# Patient Record
Sex: Male | Born: 1990 | Hispanic: Yes | Marital: Single | Smoking: Never smoker
Health system: Southern US, Community
[De-identification: ages and names within clinical notes are randomized; demographics above are authoritative.]

---

## 2021-05-30 ENCOUNTER — Emergency Department (HOSPITAL_COMMUNITY): Payer: Self-pay

## 2021-05-30 ENCOUNTER — Other Ambulatory Visit: Payer: Self-pay

## 2021-05-30 ENCOUNTER — Encounter (HOSPITAL_COMMUNITY): Payer: Self-pay | Admitting: Emergency Medicine

## 2021-05-30 ENCOUNTER — Emergency Department (HOSPITAL_COMMUNITY)
Admission: EM | Admit: 2021-05-30 | Discharge: 2021-05-30 | Disposition: A | Discharge status: Home / Self Care | Payer: Self-pay | Attending: Emergency Medicine | Admitting: Emergency Medicine

## 2021-05-30 DIAGNOSIS — R0789 Other chest pain: Secondary | ICD-10-CM | POA: Insufficient documentation

## 2021-05-30 DIAGNOSIS — R42 Dizziness and giddiness: Secondary | ICD-10-CM | POA: Insufficient documentation

## 2021-05-30 DIAGNOSIS — Z20822 Contact with and (suspected) exposure to covid-19: Secondary | ICD-10-CM | POA: Insufficient documentation

## 2021-05-30 LAB — CBC
HCT: 48.5 % (ref 39.0–52.0)
Hemoglobin: 16.7 g/dL (ref 13.0–17.0)
MCH: 28.9 pg (ref 26.0–34.0)
MCHC: 34.4 g/dL (ref 30.0–36.0)
MCV: 83.9 fL (ref 80.0–100.0)
Platelets: 206 10*3/uL (ref 150–400)
RBC: 5.78 MIL/uL (ref 4.22–5.81)
RDW: 13 % (ref 11.5–15.5)
WBC: 13.1 10*3/uL — ABNORMAL HIGH (ref 4.0–10.5)
nRBC: 0 % (ref 0.0–0.2)

## 2021-05-30 LAB — BASIC METABOLIC PANEL
Anion gap: 11 (ref 5–15)
BUN: 14 mg/dL (ref 6–20)
CO2: 23 mmol/L (ref 22–32)
Calcium: 9.4 mg/dL (ref 8.9–10.3)
Chloride: 104 mmol/L (ref 98–111)
Creatinine, Ser: 1.04 mg/dL (ref 0.61–1.24)
GFR, Estimated: >60 mL/min (ref >60)
Glucose, Bld: 153 mg/dL — ABNORMAL HIGH (ref 70–99)
Potassium: 3.3 mmol/L — ABNORMAL LOW (ref 3.5–5.1)
Sodium: 138 mmol/L (ref 135–145)

## 2021-05-30 LAB — TROPONIN I (HIGH SENSITIVITY)
Troponin I (High Sensitivity): 3 ng/L (ref <18)
Troponin I (High Sensitivity): 2 ng/L (ref <18)

## 2021-05-30 LAB — RESP PANEL BY RT-PCR (FLU A&B, COVID) ARPGX2
Influenza A by PCR: NEGATIVE
Influenza B by PCR: NEGATIVE
SARS Coronavirus 2 by RT PCR: NEGATIVE

## 2021-05-30 MED ORDER — SODIUM CHLORIDE 0.9 % IV BOLUS
1000.0000 mL | Freq: Once | INTRAVENOUS | Status: AC
  Administered 2021-05-30: 1000 mL via INTRAVENOUS

## 2021-05-30 MED ORDER — LACTATED RINGERS IV BOLUS
1000.0000 mL | Freq: Once | INTRAVENOUS | Status: AC
Start: 2021-05-30 — End: 2021-05-30
  Administered 2021-05-30: 1000 mL via INTRAVENOUS

## 2021-05-30 NOTE — ED Triage Notes (Signed)
Pt c/o CP/pressure, dizziness, feverish/chilled immediately came to hospital after symptom onset. Denies SHOB, nausea, vomiting. Unknown sick contact, no hx. Pt reports palpitations, tachycardia, recent episodes of hypertension

## 2021-05-30 NOTE — Discharge Instructions (Addendum)
Your work-up today was reassuring.  Your symptoms did resolve while you are in the emergency room.  Your cardiac enzyme were negative.  EKG was without signs of heart attack.  Chest x-ray did not show pneumonia or other concerns.  He received some IV fluids in the emergency room.  You have worsening symptoms please return to the emergency room.  Given your ongoing chest pain over the past 3 months it would be wise to set up a primary care provider.  I have attached resources for you to reach out to and establish primary care provider.

## 2021-05-30 NOTE — ED Provider Notes (Addendum)
[facility] EMERGENCY DEPARTMENT Provider Note   CSN: [id number] Arrival date & time: 05/30/21  1954     History  Chief Complaint  Patient presents with   Chest Pain   Dizziness    Mason Jenkins is a 31 y.o. male.  31 year old male presents today for evaluation of left-sided chest pain, chills, dizziness of sudden onset just prior to arrival.  Patient currently reports his symptoms have resolved with the exception of chills.  When asked about chest pain and if it occurred prior to today he endorses chest pain for the past 3 months which has been intermittent and nonradiating, without associated diaphoresis, lightheadedness, or palpitations.  Denies cough, sinus congestion, shortness of breath before or after this episode.  Without long recent travel, denies hemoptysis, without pleuritic chest pain, without leg swelling or pain.  The history is provided by the patient and a friend. A language interpreter was used.  Chest Pain Associated symptoms: dizziness   Associated symptoms: no abdominal pain, no fever, no headache, no nausea, no shortness of breath, no vomiting and no weakness   Dizziness Associated symptoms: chest pain   Associated symptoms: no headaches, no nausea, no shortness of breath, no vomiting and no weakness       Home Medications Prior to Admission medications   Not on File      Allergies    Patient has no allergy information on record.    Review of Systems   Review of Systems  Constitutional:  Positive for chills. Negative for fever.  HENT:  Negative for congestion and sore throat.   Respiratory:  Negative for shortness of breath.   Cardiovascular:  Positive for chest pain.  Gastrointestinal:  Negative for abdominal pain, nausea and vomiting.  Genitourinary:  Negative for dysuria.  Neurological:  Positive for dizziness. Negative for weakness, light-headedness and headaches.  All other systems reviewed and are negative.  Physical  Exam Updated Vital Signs There were no vitals taken for this visit. Physical Exam Vitals and nursing note reviewed.  Constitutional:      General: He is not in acute distress.    Appearance: Normal appearance. He is not ill-appearing.  HENT:     Head: Normocephalic and atraumatic.     Nose: Nose normal.  Eyes:     General: No scleral icterus.    Extraocular Movements: Extraocular movements intact.     Conjunctiva/sclera: Conjunctivae normal.  Cardiovascular:     Rate and Rhythm: Normal rate and regular rhythm.     Pulses: Normal pulses.     Heart sounds: Normal heart sounds.  Pulmonary:     Effort: Pulmonary effort is normal. No respiratory distress.     Breath sounds: Normal breath sounds. No wheezing or rales.  Abdominal:     General: There is no distension.     Palpations: Abdomen is soft.     Tenderness: There is no abdominal tenderness. There is no guarding.  Musculoskeletal:        General: Normal range of motion.     Cervical back: Normal range of motion.     Right lower leg: No edema.     Left lower leg: No edema.  Skin:    General: Skin is warm and dry.  Neurological:     General: No focal deficit present.     Mental Status: He is alert. Mental status is at baseline.    ED Results / Procedures / Treatments   Labs (all labs ordered are  listed, but only abnormal results are displayed) Labs Reviewed  BASIC METABOLIC PANEL  CBC  TROPONIN I (HIGH SENSITIVITY)    EKG None  Radiology No results found.  Procedures Procedures    Medications Ordered in ED Medications - No data to display  ED Course/ Medical Decision Making/ A&P                           Medical Decision Making Amount and/or Complexity of Data Reviewed Labs: ordered. Radiology: ordered.   Medical Decision Making / ED Course   This patient presents to the ED for concern of chest pain, fever, dizziness, this involves an extensive number of treatment options, and is a complaint that  carries with it a high risk of complications and morbidity.  The differential diagnosis includes ACS, pneumonia, URI, PE, MSK injury  MDM: 31 year old male presents today for evaluation of left-sided chest pain, chills, and dizziness of sudden onset just prior to arrival.  Patient is currently without chest pain or dizziness but does report some chills.  Patient reports intermittent history of chest pain over the past 3 months.  Unsure of alleviating or aggravating factors.  Patient also does manual labor and works outdoors.  Denies adequate hydration.  He is tachycardic at about 120.  We will provide IV hydration, obtain labs including troponin.  Obtain chest x-ray.  EKG without acute ischemic changes.  Troponin negative x2.  Respiratory panel negative for COVID, flu.  CBC with mild leukocytosis of 13,000 without other signs or symptoms of infection.  He is without abdominal pain, sinus congestion, dental infection, productive cough, dysuria, shortness of breath.  Low risk for PE given he is without hemoptysis, shortness of breath, tachypnea.  His only risk factor is tachycardia which has improved with IV hydration.  Given reassuring work-up patient is appropriate for discharge.  Will provide with resources establish primary care provider.  Return precautions discussed with patient and his parent at length.    Additional history obtained: -Additional history obtained from Through friend who is at bedside.  Confirms that patient has had chest pain over the past 3 months. -External records from outside source obtained and reviewed including: Chart review including previous notes, labs, imaging, consultation notes   Lab Tests: -I ordered, reviewed, and interpreted labs.   The pertinent results include:   Labs Reviewed  CBC - Abnormal; Notable for the following components:      Result Value   WBC 13.1 (*)    All other components within normal limits  RESP PANEL BY RT-PCR (FLU A&B, COVID) [id number]   BASIC METABOLIC PANEL  TROPONIN I (HIGH SENSITIVITY)      EKG  EKG Interpretation  Date/Time:    Ventricular Rate:    PR Interval:    QRS Duration:   QT Interval:    QTC Calculation:   R Axis:     Text Interpretation:           Imaging Studies ordered: I ordered imaging studies including chest x-ray I independently visualized and interpreted imaging. I agree with the radiologist interpretation   Medicines ordered and prescription drug management: No orders of the defined types were placed in this encounter.   -I have reviewed the patients home medicines and have made adjustments as needed  Cardiac Monitoring: The patient was maintained on a cardiac monitor.  I personally viewed and interpreted the cardiac monitored which showed an underlying rhythm of: Sinus tachycardia  Social  Determinants of Health:  Factors impacting patients care include: Does not have primary care provider established.  Did provide resources to establish primary care provider out in the community   Reevaluation: After the interventions noted above, I reevaluated the patient and found that they have :resolved  Co morbidities that complicate the patient evaluation No past medical history on file.    Dispostion: Patient remained asymptomatic in the emergency room.  Patient is appropriate for discharge.  Discharged in appropriate condition.  Plan emergency room course discussed with patient and his friend at bedside through an interpreter.  They both voiced understanding and are in agreement with plan.  Discussed importance of establishing a primary care provider and following up.  Resources to establish primary care provider provider.  Final Clinical Impression(s) / ED Diagnoses Final diagnoses:  Atypical chest pain    Rx / DC Orders ED Discharge Orders     None         Evlyn Courier, PA-C 05/30/21 2307    Evlyn Courier, PA-C 05/30/21 2316    Drenda Freeze, MD 05/30/21  [phone]

## 2021-06-10 ENCOUNTER — Other Ambulatory Visit: Payer: Self-pay

## 2021-06-10 ENCOUNTER — Emergency Department (HOSPITAL_COMMUNITY): Payer: Self-pay

## 2021-06-10 ENCOUNTER — Encounter (HOSPITAL_COMMUNITY): Payer: Self-pay | Admitting: *Deleted

## 2021-06-10 ENCOUNTER — Emergency Department (HOSPITAL_COMMUNITY)
Admission: EM | Admit: 2021-06-10 | Discharge: 2021-06-11 | Discharge status: Against Medical Advice | Payer: Self-pay | Attending: Emergency Medicine | Admitting: Emergency Medicine

## 2021-06-10 DIAGNOSIS — R42 Dizziness and giddiness: Secondary | ICD-10-CM | POA: Insufficient documentation

## 2021-06-10 DIAGNOSIS — Z5321 Procedure and treatment not carried out due to patient leaving prior to being seen by health care provider: Secondary | ICD-10-CM | POA: Insufficient documentation

## 2021-06-10 DIAGNOSIS — Z20822 Contact with and (suspected) exposure to covid-19: Secondary | ICD-10-CM | POA: Insufficient documentation

## 2021-06-10 DIAGNOSIS — R030 Elevated blood-pressure reading, without diagnosis of hypertension: Secondary | ICD-10-CM | POA: Insufficient documentation

## 2021-06-10 DIAGNOSIS — R110 Nausea: Secondary | ICD-10-CM | POA: Insufficient documentation

## 2021-06-10 MED ORDER — MECLIZINE HCL 25 MG PO TABS
25.0000 mg | ORAL_TABLET | Freq: Once | ORAL | Status: AC
Start: 2021-06-10 — End: 2021-06-10
  Administered 2021-06-10: 25 mg via ORAL
  Filled 2021-06-10 (×2): qty 1

## 2021-06-10 NOTE — ED Notes (Signed)
Patient transported to CT 

## 2021-06-10 NOTE — ED Provider Triage Note (Signed)
Emergency Medicine Provider Triage Evaluation Note  Mason Jenkins , a 31 y.o. male  was evaluated in triage.  Pt complains of Dizziness and vomiting.Marland Kitchen onset a few hours ago. He feels agitated. Nausea is improved. Balance is off when standing. No hx of the same. No ringing in his ear  Review of Systems  Positive: dizziness Negative: Vision change  Physical Exam  BP (!) 159/103 (BP Location: Right Arm)    Pulse 87    Temp 99.3 F (37.4 C) (Oral)    Resp 16    SpO2 100%  Gen:   Awake, no distress   Resp:  Normal effort  MSK:   Moves extremities without difficulty  Other:  Mild Right lateral nystagmus  Medical Decision Making  Medically screening exam initiated at 11:16 PM.  Appropriate orders placed.  Mason Jenkins was informed that the remainder of the evaluation will be completed by another provider, this initial triage assessment does not replace that evaluation, and the importance of remaining in the ED until their evaluation is complete.  Work up initiated   [employer], PA-C 06/10/21 2318

## 2021-06-10 NOTE — ED Triage Notes (Signed)
Pt states he became dizzy and nauseated today, significant other says that his blood pressure was elevated 160's /110's when she took it tonight. Denies pain.

## 2021-06-11 LAB — DIFFERENTIAL
Abs Immature Granulocytes: 0.04 10*3/uL (ref 0.00–0.07)
Basophils Absolute: 0.1 10*3/uL (ref 0.0–0.1)
Basophils Relative: 1 %
Eosinophils Absolute: 0.1 10*3/uL (ref 0.0–0.5)
Eosinophils Relative: 1 %
Immature Granulocytes: 0 %
Lymphocytes Relative: 25 %
Lymphs Abs: 2.3 10*3/uL (ref 0.7–4.0)
Monocytes Absolute: 0.5 10*3/uL (ref 0.1–1.0)
Monocytes Relative: 5 %
Neutro Abs: 6.2 10*3/uL (ref 1.7–7.7)
Neutrophils Relative %: 68 %

## 2021-06-11 LAB — I-STAT CHEM 8, ED
BUN: 16 mg/dL (ref 6–20)
Calcium, Ion: 1.17 mmol/L (ref 1.15–1.40)
Chloride: 105 mmol/L (ref 98–111)
Creatinine, Ser: 0.8 mg/dL (ref 0.61–1.24)
Glucose, Bld: 109 mg/dL — ABNORMAL HIGH (ref 70–99)
HCT: 48 % (ref 39.0–52.0)
Hemoglobin: 16.3 g/dL (ref 13.0–17.0)
Potassium: 3.6 mmol/L (ref 3.5–5.1)
Sodium: 140 mmol/L (ref 135–145)
TCO2: 25 mmol/L (ref 22–32)

## 2021-06-11 LAB — CBC
HCT: 47 % (ref 39.0–52.0)
Hemoglobin: 16.1 g/dL (ref 13.0–17.0)
MCH: 28.9 pg (ref 26.0–34.0)
MCHC: 34.3 g/dL (ref 30.0–36.0)
MCV: 84.2 fL (ref 80.0–100.0)
Platelets: 220 10*3/uL (ref 150–400)
RBC: 5.58 MIL/uL (ref 4.22–5.81)
RDW: 12.7 % (ref 11.5–15.5)
WBC: 9.2 10*3/uL (ref 4.0–10.5)
nRBC: 0 % (ref 0.0–0.2)

## 2021-06-11 LAB — URINALYSIS, ROUTINE W REFLEX MICROSCOPIC
Bacteria, UA: NONE SEEN
Bilirubin Urine: NEGATIVE
Glucose, UA: NEGATIVE mg/dL
Hgb urine dipstick: NEGATIVE
Ketones, ur: NEGATIVE mg/dL
Leukocytes,Ua: NEGATIVE
Nitrite: NEGATIVE
Protein, ur: NEGATIVE mg/dL
Specific Gravity, Urine: 1.015 (ref 1.005–1.030)
pH: 7 (ref 5.0–8.0)

## 2021-06-11 LAB — COMPREHENSIVE METABOLIC PANEL
ALT: 124 U/L — ABNORMAL HIGH (ref 0–44)
AST: 59 U/L — ABNORMAL HIGH (ref 15–41)
Albumin: 3.9 g/dL (ref 3.5–5.0)
Alkaline Phosphatase: 70 U/L (ref 38–126)
Anion gap: 11 (ref 5–15)
BUN: 14 mg/dL (ref 6–20)
CO2: 21 mmol/L — ABNORMAL LOW (ref 22–32)
Calcium: 9.2 mg/dL (ref 8.9–10.3)
Chloride: 105 mmol/L (ref 98–111)
Creatinine, Ser: 0.9 mg/dL (ref 0.61–1.24)
GFR, Estimated: >60 mL/min (ref >60)
Glucose, Bld: 116 mg/dL — ABNORMAL HIGH (ref 70–99)
Potassium: 3.5 mmol/L (ref 3.5–5.1)
Sodium: 137 mmol/L (ref 135–145)
Total Bilirubin: 0.6 mg/dL (ref 0.3–1.2)
Total Protein: 7.5 g/dL (ref 6.5–8.1)

## 2021-06-11 LAB — RESP PANEL BY RT-PCR (FLU A&B, COVID) ARPGX2
Influenza A by PCR: NEGATIVE
Influenza B by PCR: NEGATIVE
SARS Coronavirus 2 by RT PCR: NEGATIVE

## 2021-06-11 LAB — RAPID URINE DRUG SCREEN, HOSP PERFORMED
Amphetamines: NOT DETECTED
Barbiturates: NOT DETECTED
Benzodiazepines: NOT DETECTED
Cocaine: NOT DETECTED
Opiates: NOT DETECTED
Tetrahydrocannabinol: NOT DETECTED

## 2021-06-11 LAB — PROTIME-INR
INR: 1 (ref 0.8–1.2)
Prothrombin Time: 13.3 seconds (ref 11.4–15.2)

## 2021-06-11 LAB — APTT: aPTT: 28 seconds (ref 24–36)

## 2021-06-11 LAB — ETHANOL: Alcohol, Ethyl (B): <10 mg/dL (ref <10)

## 2021-06-11 NOTE — ED Notes (Signed)
No answer for VS x3 

## 2021-06-12 ENCOUNTER — Ambulatory Visit (HOSPITAL_COMMUNITY)
Admission: EM | Admit: 2021-06-12 | Discharge: 2021-06-12 | Disposition: A | Discharge status: Home / Self Care | Payer: Self-pay | Attending: Family Medicine | Admitting: Family Medicine

## 2021-06-12 ENCOUNTER — Encounter (HOSPITAL_COMMUNITY): Payer: Self-pay

## 2021-06-12 ENCOUNTER — Other Ambulatory Visit: Payer: Self-pay

## 2021-06-12 DIAGNOSIS — J019 Acute sinusitis, unspecified: Secondary | ICD-10-CM

## 2021-06-12 DIAGNOSIS — I10 Essential (primary) hypertension: Secondary | ICD-10-CM

## 2021-06-12 DIAGNOSIS — R011 Cardiac murmur, unspecified: Secondary | ICD-10-CM

## 2021-06-12 MED ORDER — CETIRIZINE HCL 10 MG PO TABS: 10.0000 mg | ORAL_TABLET | Freq: Every day | ORAL | 0 refills | Status: AC

## 2021-06-12 MED ORDER — AMLODIPINE BESYLATE 5 MG PO TABS: 5.0000 mg | ORAL_TABLET | Freq: Every day | ORAL | 0 refills | Status: AC

## 2021-06-12 MED ORDER — AMOXICILLIN 875 MG PO TABS: 875.0000 mg | ORAL_TABLET | Freq: Two times a day (BID) | ORAL | 0 refills | Status: AC

## 2021-06-12 NOTE — ED Triage Notes (Signed)
Pt presents with nausea, HA, and chills that began 1 week ago.

## 2021-06-12 NOTE — Discharge Instructions (Addendum)
Cardiology office will contact you to schedule a follow-up appointment.  Please keep appointment as I do recommend you have a full cardiac work-up to determine the source of the murmur and for monitoring of your high blood pressure

## 2021-06-12 NOTE — ED Provider Notes (Signed)
[facility]    CSN: [id number] Arrival date & time: 06/12/21  1813      History   Chief Complaint Chief Complaint  Patient presents with   Nausea   Chills   Headache    HPI Mason Jenkins is a 31 y.o. male.   HPI Patient presents today with nausea, chills and headache.  Patient has been seen at the ER twice within the last 2 weeks.  Most recent visit was on June 10, 2021 which was 2 days ago.  Patient was seen with a headache along with nausea and chills.  Patient had a head CT , findings consistent with that of sinus disease due to moderate mucosal thickening in the maxillary sinus region.  Patient was not seen by provider therefore was not treated at the ER.  Of note patient has had high blood pressure readings at home per wife who is present with him at visit today.  Patient's vital signs were taken on February 8 blood pressure was 153/103 and today BP 162/100.  Wife reports she thought blood pressure was elevated due to stress.  Patient has had no preventative healthcare therefore is unaware of how long his blood pressure has been elevated.  He continues to have headache occasional dizziness and nausea.  He has a low-grade fever of 99.2.  He has not taken any medication for symptoms.  He is not currently experiencing any chest pain or shortness of breath  History reviewed. No pertinent past medical history.  There are no problems to display for this patient.   History reviewed. No pertinent surgical history.     Home Medications    Prior to Admission medications   Medication Sig Start Date End Date Taking? Authorizing Provider  amLODipine (NORVASC) 5 MG tablet Take 1 tablet (5 mg total) by mouth daily. 06/12/21  Yes Bing Neighbors, FNP  amoxicillin (AMOXIL) 875 MG tablet Take 1 tablet (875 mg total) by mouth 2 (two) times daily. 06/12/21  Yes Bing Neighbors, FNP  cetirizine (ZYRTEC) 10 MG tablet Take 1 tablet (10 mg total) by mouth daily. 06/12/21   Yes Bing Neighbors, FNP  naproxen sodium (ALEVE) 220 MG tablet Take 440 mg by mouth daily as needed (headache).    [provider]    Family History History reviewed. No pertinent family history.  Social History Social History   Tobacco Use   Smoking status: Never   Smokeless tobacco: Never     Allergies   Patient has no known allergies.   Review of Systems Review of Systems Pertinent negatives listed in HPI   Physical Exam Triage Vital Signs ED Triage Vitals  Enc Vitals Group     BP 06/12/21 1828 (!) 162/100     Pulse Rate 06/12/21 1828 100     Resp 06/12/21 1828 14     Temp 06/12/21 1828 99.2 F (37.3 C)     Temp Source 06/12/21 1828 Oral     SpO2 06/12/21 1828 96 %     Weight --      Height --      Head Circumference --      Peak Flow --      Pain Score 06/12/21 1830 0     Pain Loc --      Pain Edu? --      Excl. in GC? --    No data found.  Updated Vital Signs BP (!) 162/100 (BP Location: Right Arm)  Pulse 100    Temp 99.2 F (37.3 C) (Oral)    Resp 14    SpO2 96%   Visual Acuity Right Eye Distance:   Left Eye Distance:   Bilateral Distance:    Right Eye Near:   Left Eye Near:    Bilateral Near:     Physical Exam Constitutional:      Appearance: He is obese. He is ill-appearing.  HENT:     Head: Normocephalic.     Nose: Congestion and rhinorrhea present.  Eyes:     Extraocular Movements: Extraocular movements intact.     Pupils: Pupils are equal, round, and reactive to light.  Neck:     Vascular: Normal carotid pulses. No JVD.     Trachea: Trachea normal.  Cardiovascular:     Rate and Rhythm: Regular rhythm. Tachycardia present.     Heart sounds: Murmur heard.     Comments: Grade 3 murmur present 3rd ICS Musculoskeletal:     Cervical back: Normal range of motion.  Lymphadenopathy:     Cervical: Cervical adenopathy present.  Skin:    General: Skin is warm and dry.     Capillary Refill: Capillary refill takes less than  2 seconds.  Neurological:     General: No focal deficit present.     Mental Status: He is oriented to person, place, and time.  Psychiatric:        Mood and Affect: Mood normal.        Behavior: Behavior normal.        Thought Content: Thought content normal.     UC Treatments / Results  Labs (all labs ordered are listed, but only abnormal results are displayed) Labs Reviewed - No data to display  EKG   Radiology CT HEAD WO CONTRAST  Result Date: 06/10/2021 CLINICAL DATA:  Dizziness EXAM: CT HEAD WITHOUT CONTRAST TECHNIQUE: Contiguous axial images were obtained from the base of the skull through the vertex without intravenous contrast. RADIATION DOSE REDUCTION: This exam was performed according to the departmental dose-optimization program which includes automated exposure control, adjustment of the mA and/or kV according to patient size and/or use of iterative reconstruction technique. COMPARISON:  None. FINDINGS: Brain: No evidence of acute infarction, hemorrhage, hydrocephalus, extra-axial collection or mass lesion/mass effect. Vascular: No hyperdense vessel or unexpected calcification. Skull: Normal. Negative for fracture or focal lesion. Sinuses/Orbits: Moderate mucosal thickening in the maxillary sinuses Other: None IMPRESSION: 1. Negative non contrasted CT appearance of the brain. 2. Sinus disease Electronically Signed   By: Jasmine Pang M.D.   On: 06/10/2021 23:57    Procedures Procedures (including critical care time)  Medications Ordered in UC Medications - No data to display  Initial Impression / Assessment and Plan / UC Course  I have reviewed the triage vital signs and the nursing notes.  Pertinent labs & imaging results that were available during my care of the patient were reviewed by me and considered in my medical decision making (see chart for details).    Essential hypertension uncontrolled, treatment today start amlodipine 5 mg daily at bedtime for control of  blood pressure.  Continue to monitor blood pressure at home.  Referral placed to cardiology due to undiagnosed audible heart murmur auscultated on exam.  Given patient's elevated blood pressure and murmur patient warrants further work-up in the setting of cardiology as patient has not had any preventative maintenance care uncertain of how long patient has been suffering from uncontrolled hypertension.  Acute sinusitis treatment today  with amoxicillin 875 twice daily for 10 days.  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  Strict ER precautions given if any of his symptoms worsen or do not readily improve.  Return here for evaluation as needed.  Final Clinical Impressions(s) / UC Diagnoses   Final diagnoses:  Essential hypertension  Undiagnosed cardiac murmurs  Acute non-recurrent sinusitis, unspecified location     Discharge Instructions      Cardiology office will contact you to schedule a follow-up appointment.  Please keep appointment as I do recommend you have a full cardiac work-up to determine the source of the murmur and for monitoring of your high blood pressure     ED Prescriptions     Medication Sig Dispense Auth. Provider   amoxicillin (AMOXIL) 875 MG tablet Take 1 tablet (875 mg total) by mouth 2 (two) times daily. 20 tablet Bing Neighbors, FNP   cetirizine (ZYRTEC) 10 MG tablet Take 1 tablet (10 mg total) by mouth daily. 90 tablet Bing Neighbors, FNP   amLODipine (NORVASC) 5 MG tablet Take 1 tablet (5 mg total) by mouth daily. 90 tablet Bing Neighbors, FNP      PDMP not reviewed this encounter.   Bing Neighbors, [place] 06/12/21 2036

## 2021-06-15 ENCOUNTER — Emergency Department (HOSPITAL_COMMUNITY)
Admission: EM | Admit: 2021-06-15 | Discharge: 2021-06-15 | Disposition: A | Discharge status: Home / Self Care | Payer: Self-pay | Attending: Emergency Medicine | Admitting: Emergency Medicine

## 2021-06-15 ENCOUNTER — Encounter (HOSPITAL_COMMUNITY): Payer: Self-pay | Admitting: *Deleted

## 2021-06-15 ENCOUNTER — Emergency Department (HOSPITAL_COMMUNITY): Payer: Self-pay

## 2021-06-15 ENCOUNTER — Other Ambulatory Visit: Payer: Self-pay

## 2021-06-15 DIAGNOSIS — R7989 Other specified abnormal findings of blood chemistry: Secondary | ICD-10-CM

## 2021-06-15 DIAGNOSIS — M7918 Myalgia, other site: Secondary | ICD-10-CM | POA: Insufficient documentation

## 2021-06-15 DIAGNOSIS — R000 Tachycardia, unspecified: Secondary | ICD-10-CM | POA: Insufficient documentation

## 2021-06-15 DIAGNOSIS — I10 Essential (primary) hypertension: Secondary | ICD-10-CM | POA: Insufficient documentation

## 2021-06-15 DIAGNOSIS — E039 Hypothyroidism, unspecified: Secondary | ICD-10-CM | POA: Insufficient documentation

## 2021-06-15 LAB — COMPREHENSIVE METABOLIC PANEL
ALT: 134 U/L — ABNORMAL HIGH (ref 0–44)
AST: 79 U/L — ABNORMAL HIGH (ref 15–41)
Albumin: 4.1 g/dL (ref 3.5–5.0)
Alkaline Phosphatase: 75 U/L (ref 38–126)
Anion gap: 11 (ref 5–15)
BUN: 10 mg/dL (ref 6–20)
CO2: 20 mmol/L — ABNORMAL LOW (ref 22–32)
Calcium: 9.3 mg/dL (ref 8.9–10.3)
Chloride: 106 mmol/L (ref 98–111)
Creatinine, Ser: 0.86 mg/dL (ref 0.61–1.24)
GFR, Estimated: >60 mL/min (ref >60)
Glucose, Bld: 110 mg/dL — ABNORMAL HIGH (ref 70–99)
Potassium: 4.9 mmol/L (ref 3.5–5.1)
Sodium: 137 mmol/L (ref 135–145)
Total Bilirubin: 1.6 mg/dL — ABNORMAL HIGH (ref 0.3–1.2)
Total Protein: 7.7 g/dL (ref 6.5–8.1)

## 2021-06-15 LAB — T4, FREE: Free T4: 1.42 ng/dL — ABNORMAL HIGH (ref 0.61–1.12)

## 2021-06-15 LAB — CBC WITH DIFFERENTIAL/PLATELET
Abs Immature Granulocytes: 0.02 10*3/uL (ref 0.00–0.07)
Basophils Absolute: 0.1 10*3/uL (ref 0.0–0.1)
Basophils Relative: 1 %
Eosinophils Absolute: 0 10*3/uL (ref 0.0–0.5)
Eosinophils Relative: 0 %
HCT: 51.3 % (ref 39.0–52.0)
Hemoglobin: 17.2 g/dL — ABNORMAL HIGH (ref 13.0–17.0)
Immature Granulocytes: 0 %
Lymphocytes Relative: 19 %
Lymphs Abs: 1.8 10*3/uL (ref 0.7–4.0)
MCH: 28.6 pg (ref 26.0–34.0)
MCHC: 33.5 g/dL (ref 30.0–36.0)
MCV: 85.2 fL (ref 80.0–100.0)
Monocytes Absolute: 0.5 10*3/uL (ref 0.1–1.0)
Monocytes Relative: 5 %
Neutro Abs: 7.1 10*3/uL (ref 1.7–7.7)
Neutrophils Relative %: 75 %
Platelets: 228 10*3/uL (ref 150–400)
RBC: 6.02 MIL/uL — ABNORMAL HIGH (ref 4.22–5.81)
RDW: 12.8 % (ref 11.5–15.5)
WBC: 9.5 10*3/uL (ref 4.0–10.5)
nRBC: 0 % (ref 0.0–0.2)

## 2021-06-15 LAB — TSH: TSH: 0.998 u[IU]/mL (ref 0.350–4.500)

## 2021-06-15 LAB — LIPASE, BLOOD: Lipase: 28 U/L (ref 11–51)

## 2021-06-15 MED ORDER — SODIUM CHLORIDE 0.9 % IV BOLUS
1000.0000 mL | Freq: Once | INTRAVENOUS | Status: AC
  Administered 2021-06-15: 1000 mL via INTRAVENOUS

## 2021-06-15 MED ORDER — LORAZEPAM 2 MG/ML IJ SOLN
0.5000 mg | Freq: Once | INTRAMUSCULAR | Status: AC
  Administered 2021-06-15: 0.5 mg via INTRAVENOUS
  Filled 2021-06-15: qty 1

## 2021-06-15 NOTE — ED Triage Notes (Signed)
C/o generalized bodyaches onset last week was seen at [facility] on Friday was said to have high blood pressure and was started on new medication

## 2021-06-15 NOTE — ED Provider Notes (Signed)
Care assumed from Arthor Captain at shift change, please see their note for full detail, but in brief Mason Jenkins is a 31 y.o. male presents with anxiousness/tremulousness.  Plan: Pending TSH  Physical Exam  BP (!) 153/95    Pulse (!) 118    Temp 98.5 F (36.9 C) (Oral)    Resp 18    Ht 5\' 9"  (1.753 m)    Wt 108.9 kg    SpO2 98%    BMI 35.44 kg/m   Physical Exam  Procedures  Procedures  ED Course / MDM    Medical Decision Making Amount and/or Complexity of Data Reviewed Labs: ordered. Radiology: ordered. ECG/medicine tests: ordered.  Risk Prescription drug management.   Patient's TSH was normal, however his T4 was elevated.  Elevated amounts of thyroid hormone likely explain his ongoing palpitations and tremulousness.  He improved with 1 mg Ativan while here in the ED.  I discussed all results with patient and his girlfriend and they are understanding.  I strongly suggested that he find a primary care doctor in order to have this evaluated and have any long-term medications prescribed.  I consulted with the transition of care team and Camelia, RN set patient up with a internal medicine appointment 1 week from today.  All questions were asked and answered and patient was discharged home in good condition.  Vitals were stable at discharge.      06/16/21 2217    2218, MD 06/18/21 [phone]

## 2021-06-15 NOTE — Discharge Instructions (Addendum)
Your work-up today was overall reassuring.  We did note that your thyroid levels were a bit elevated.  When this is elevated, it can cause all the symptoms you are complaining about, including headache, elevated heart rate, high blood pressure, dizziness and tremors.  We have set you up with an appointment with a primary care doctor 1 week from now who can get you on the proper medications to bring these levels down.  Unfortunately, we cannot prescribe this medication here in the emergency department as this is a long-term medication.  Return to the emergency department if you have worsening chest pain, fevers, change in mental status or worsening tremors.  Su trabajo de hoy fue en general tranquilizador.  Notamos que sus niveles de tiroides estaban un poco elevados.  Cuando esto es [place], puede causar todos los sntomas de los que se [place], incluyendo dolor de cabeza, frecuencia cardaca elevada, presin arterial alta, mareos y temblores.  Le hemos programado una cita con un mdico de atencin primaria dentro de 1 semana que puede obtener los medicamentos adecuados para reducir [employer].   Desafortunadamente, no podemos recetar este medicamento aqu en el departamento de emergencias, ya que es un medicamento a [employer].  Regrese al departamento de emergencias si tiene empeoramiento del dolor torcico, fiebre, cambios en el estado mental o temblores que [place].

## 2021-06-15 NOTE — ED Provider Notes (Signed)
[facility] EMERGENCY DEPARTMENT Provider Note   CSN: [id number] Arrival date & time: 06/15/21  0455     History  Chief Complaint  Patient presents with   Generalized Body Aches    Mason Jenkins is a 31 y.o. male who presents emergency department with multiple complaints.  Patient has been seen twice by providers in both the emergency department and urgent care for the same complaint.  I saw the patient in triage 5 days ago but he left before being evaluated due to long wait times.  Since 28 January the patient has been having symptoms of extreme anxiety, shakiness, nausea, loss of appetite, fatigue.  He feels dizzy when he stands at times.  He was seen at an urgent care a few days ago and prescribed medication to treat his hypertension which is new in onset however it has not helped reduce his blood pressure symptoms at all.  He denies chest pain he denies weight loss he denies abdominal pain but does feel like his stomach is "upset."  His wife states that her grandfather was living with them and died 05/29/2023 and she wonders if he might be grieving.  The patient does not use alcohol he does not use any street drugs he is not on medications other than the blood pressure medication prescribed just recently for him.  He denies chest pain, shortness of breath or exertional dyspnea he has had a slight cough.   HPI     Home Medications Prior to Admission medications   Medication Sig Start Date End Date Taking? Authorizing Provider  amLODipine (NORVASC) 5 MG tablet Take 1 tablet (5 mg total) by mouth daily. 06/12/21   Scot Jun, FNP  amoxicillin (AMOXIL) 875 MG tablet Take 1 tablet (875 mg total) by mouth 2 (two) times daily. 06/12/21   Scot Jun, FNP  cetirizine (ZYRTEC) 10 MG tablet Take 1 tablet (10 mg total) by mouth daily. 06/12/21   Scot Jun, FNP  naproxen sodium (ALEVE) 220 MG tablet Take 440 mg by mouth daily as needed (headache).     [provider]      Allergies    Patient has no known allergies.    Review of Systems   Review of Systems  Physical Exam Updated Vital Signs BP (!) 153/95    Pulse (!) 118    Temp 98.5 F (36.9 C) (Oral)    Resp 18    Ht 5\' 9"  (1.753 m)    Wt 108.9 kg    SpO2 98%    BMI 35.44 kg/m  Physical Exam Vitals and nursing note reviewed.  Constitutional:      General: He is not in acute distress.    Appearance: He is well-developed. He is not diaphoretic.  HENT:     Head: Normocephalic and atraumatic.  Eyes:     General: No scleral icterus.    Conjunctiva/sclera: Conjunctivae normal.  Cardiovascular:     Rate and Rhythm: Regular rhythm. Tachycardia present.     Heart sounds: Normal heart sounds. No murmur heard. Pulmonary:     Effort: Pulmonary effort is normal. No respiratory distress.     Breath sounds: Normal breath sounds.  Abdominal:     Palpations: Abdomen is soft.     Tenderness: There is no abdominal tenderness.  Musculoskeletal:     Cervical back: Normal range of motion and neck supple.  Skin:    General: Skin is warm and dry.  Neurological:  Mental Status: He is alert and oriented to person, place, and time.  Psychiatric:        Behavior: Behavior normal.    ED Results / Procedures / Treatments   Labs (all labs ordered are listed, but only abnormal results are displayed) Labs Reviewed  COMPREHENSIVE METABOLIC PANEL  CBC WITH DIFFERENTIAL/PLATELET  TSH  T4, FREE  LIPASE, BLOOD    EKG None  Radiology No results found.  Procedures Procedures    Medications Ordered in ED Medications - No data to display  ED Course/ Medical Decision Making/ A&P                           Medical Decision Making Patient here with c/o palpitations, tremulousness, dizziness, fatigue, anxiety. The differential diagnosis for palpitations includes cardiac arrhythmias, PVC/PAC, ACS, Cardiomyopathy, CHF, MVP, pericarditis, valvular disease, Panic/Anxiety,  Somatic disorder, ETOH, Caffeine,  Stimulant use, medication side effect, Anemia, Hyperthyroidism, pulmonary embolism.  Patient social determinants of health include language barrier, uninsured and no op follow up.  The patient has had multiple visits for the same complaint. I have high concern that the patient's sxs may be related to hyperthyroidism, less likely to be pheochromocytoma as this is extremely rare, or adrenal issue. Patient does not seem to have underlying psychiatric hx and is clearly tachycardic and hypertensive- all of which are new. He does not drink etoh or use stimulant.    Work up is currently still pending and I have given sign out to PA [place] at shift change.  Amount and/or Complexity of Data Reviewed Labs: ordered.    Details: Labs currently pending. Radiology: ordered and independent interpretation performed.    Details: i visualized the patient's CXR- no edema or cardiomegaly- no acute findings, agree with radiologist ECG/medicine tests: ordered and independent interpretation performed.    Details: sinus tach rate of 106 placed on cardiac monitor for monitoring of abnormal rate  Risk Prescription drug management.  Final Clinical Impression(s) / ED Diagnoses Final diagnoses:  None    Rx / DC Orders ED Discharge Orders     None         Margarita Mail, PA-C 06/17/21 1235    Orpah Greek, MD 06/20/21 [phone]

## 2021-06-15 NOTE — Progress Notes (Signed)
°   06/15/21 1148  TOC ED Mini Assessment  TOC Time spent with patient (minutes): 30  PING Used in TOC Assessment No  Admission or Readmission Diverted Yes  Interventions which prevented an admission or readmission PCP Appointment Scheduled  What brought you to the Emergency Department?  Chest pain  Barriers to Discharge ED Uninsured needing PCP establishment  Barrier interventions Appointment with [facility]  Means of departure Car  Spoke with Laverda Sorenson, friend ant bedside   Daulton Harbaugh J. Lucretia Roers, RN, BSN, [place] [phone]  RNCM set up appointment with [facility] on 2/20 @ 1:15.  Spoke with pt and interpreter (friend) at bedside and advised to please arrive 15 min early and take a picture ID and your current medications.  Pt verbalizes understanding of keeping appointment.

## 2021-06-22 ENCOUNTER — Encounter: Payer: Self-pay | Admitting: Internal Medicine

## 2021-07-07 ENCOUNTER — Ambulatory Visit (HOSPITAL_BASED_OUTPATIENT_CLINIC_OR_DEPARTMENT_OTHER): Payer: Self-pay | Admitting: Cardiology

## 2021-07-23 ENCOUNTER — Encounter (HOSPITAL_BASED_OUTPATIENT_CLINIC_OR_DEPARTMENT_OTHER): Payer: Self-pay

## 2023-02-05 IMAGING — DX DG CHEST 2V
2 series · 2 of 2 positions shown · non-contrast
Comparison: None.

CLINICAL DATA: Chest pain and pressure

EXAM:
CHEST - 2 VIEW

[chest pa]
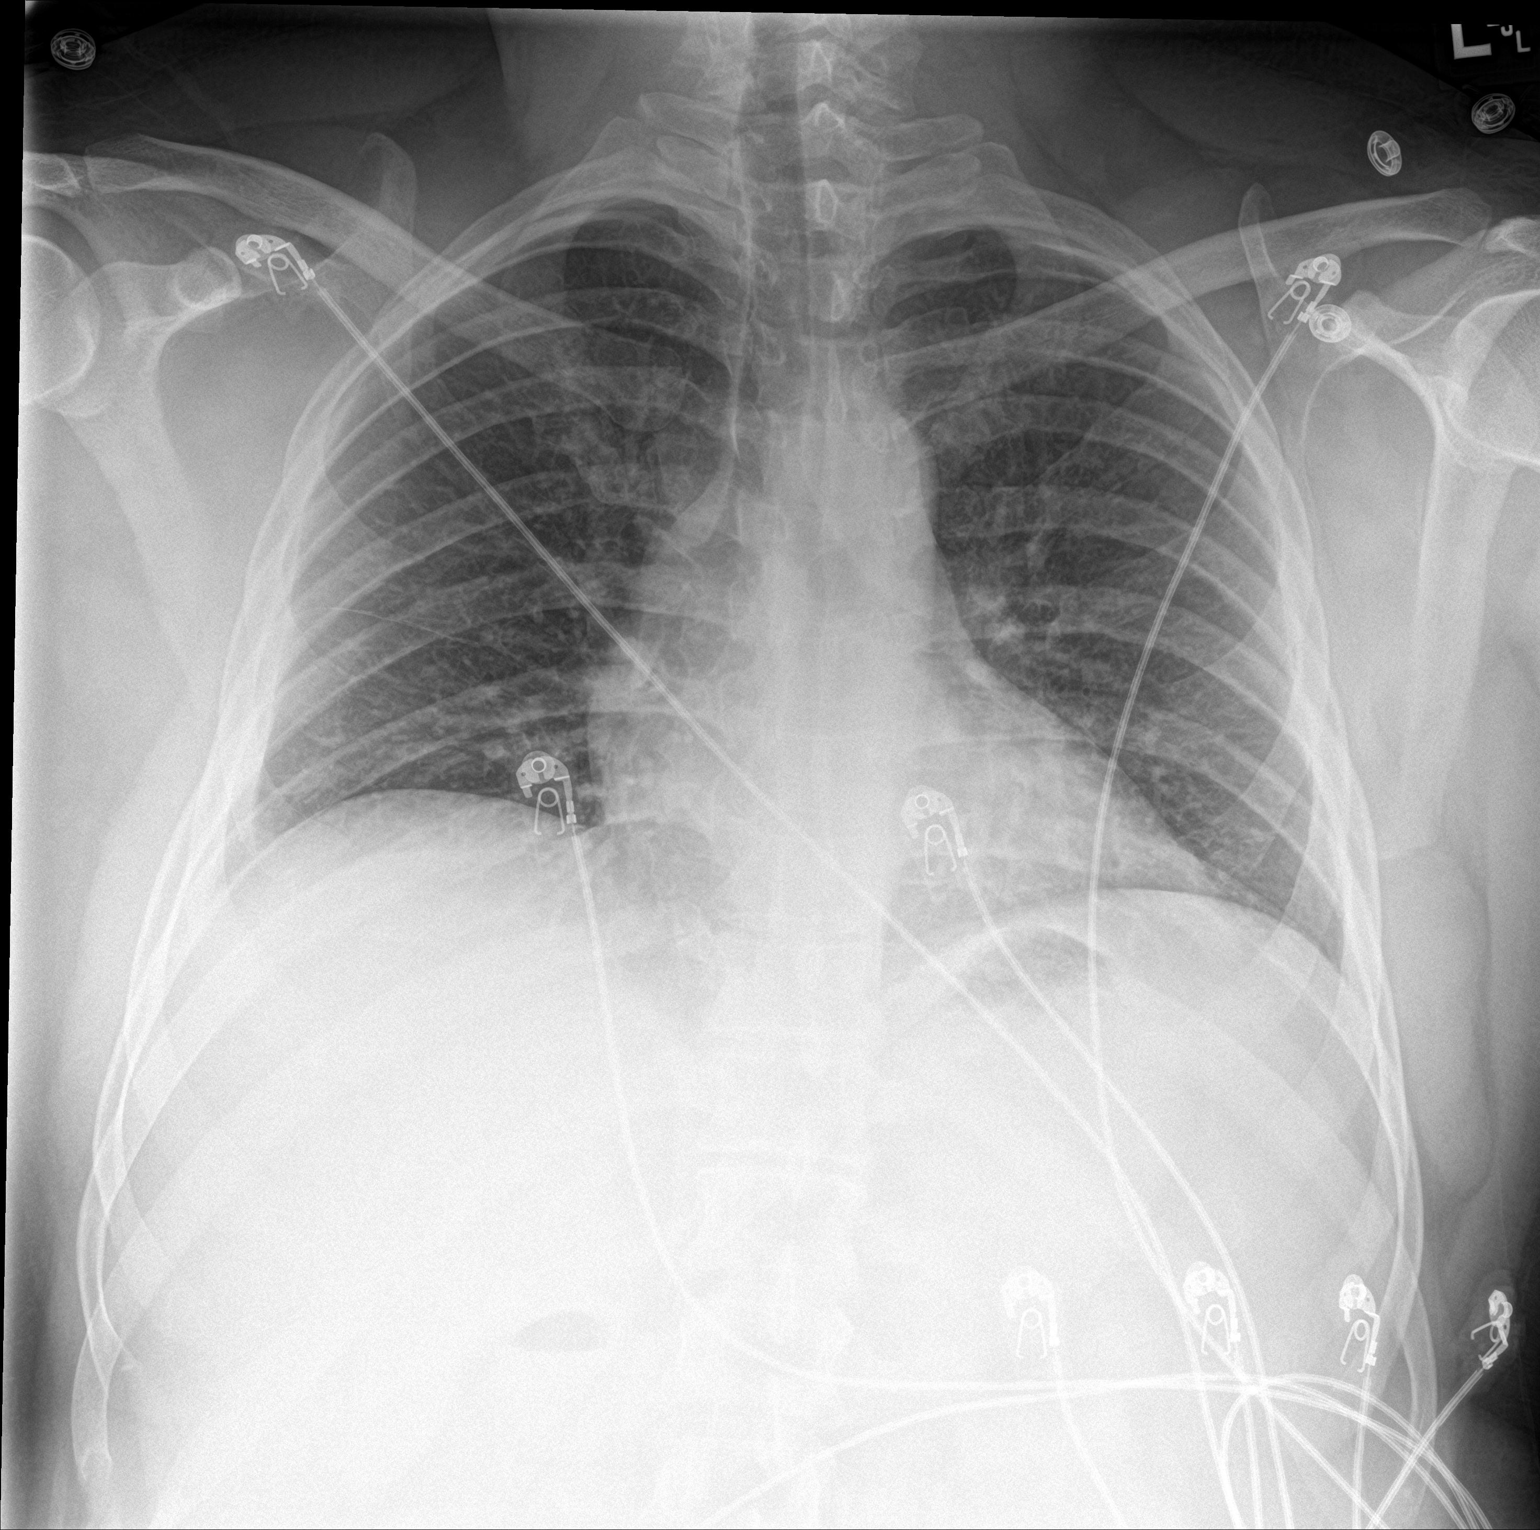

[chest lat]
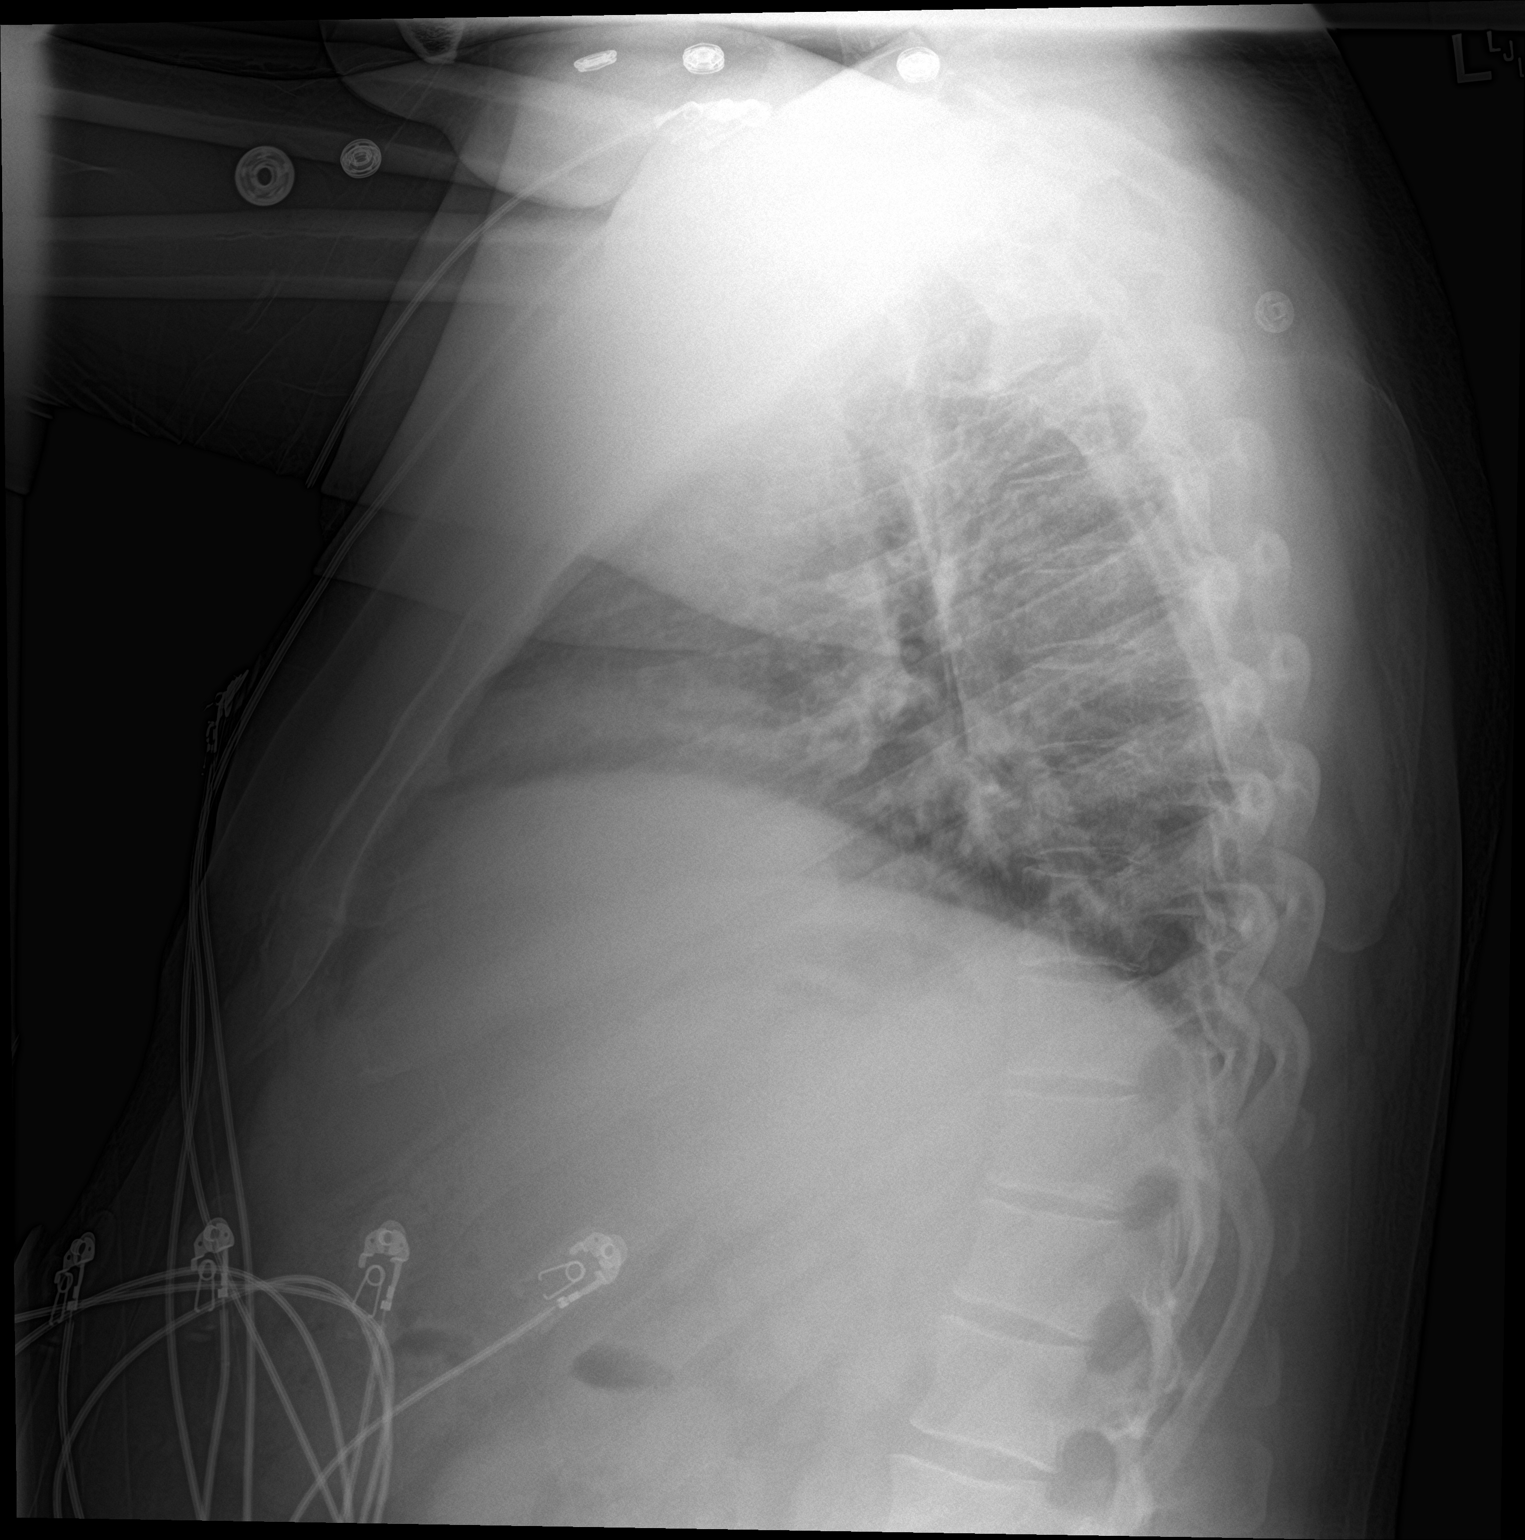

[2 of 2 positions shown; findings below may reference images not displayed]

FINDINGS: The heart size and mediastinal contours are within normal limits.
Both lungs are clear. The visualized skeletal structures are
unremarkable.
IMPRESSION: No active cardiopulmonary disease.

## 2023-02-16 IMAGING — CT CT HEAD W/O CM
4 series · 16 of 47 positions shown, 18 images · non-contrast
Comparison: None.

CLINICAL DATA: Dizziness



[Series 3: head wo · axial · 0.52mm/px · z∈[-86,+34]mm · 7 of 34 slices shown, 9 images]
[im 5/34  brain]
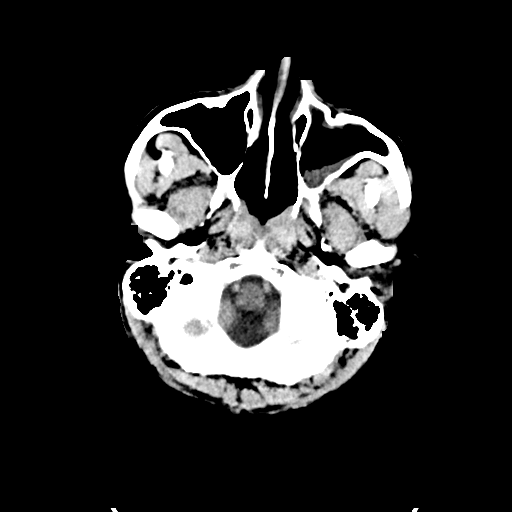
[im 5/34  bone]
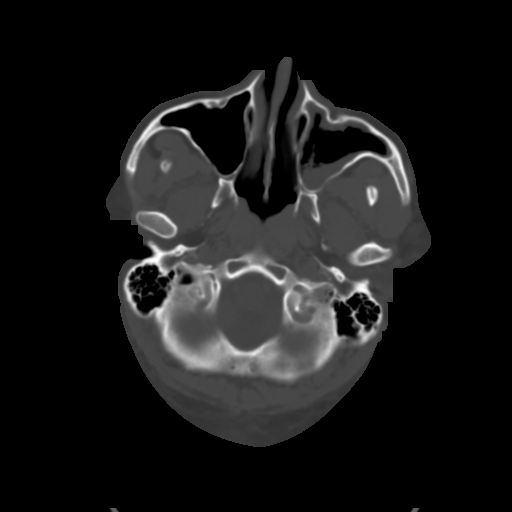
[im 9/34  brain]
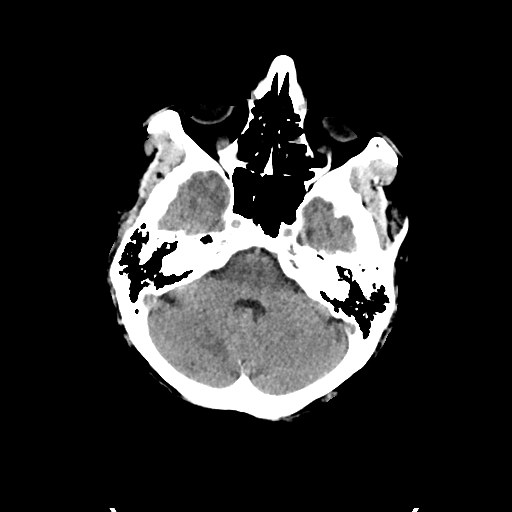
[im 13/34  brain]
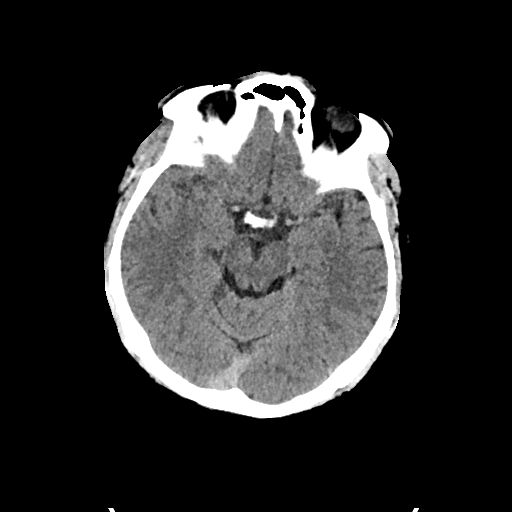
[im 17/34  brain]
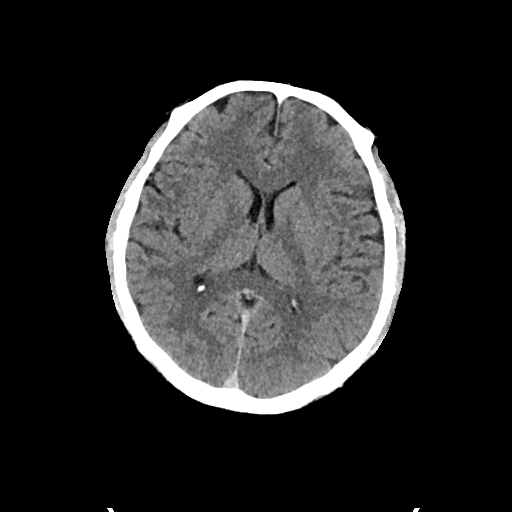
[im 21/34  brain]
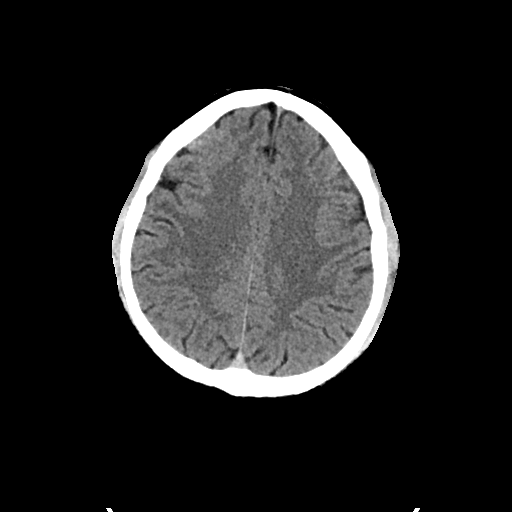
[im 21/34  bone]
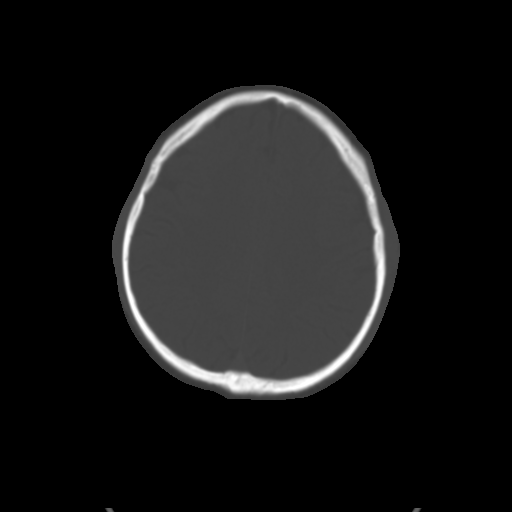
[im 25/34  brain]
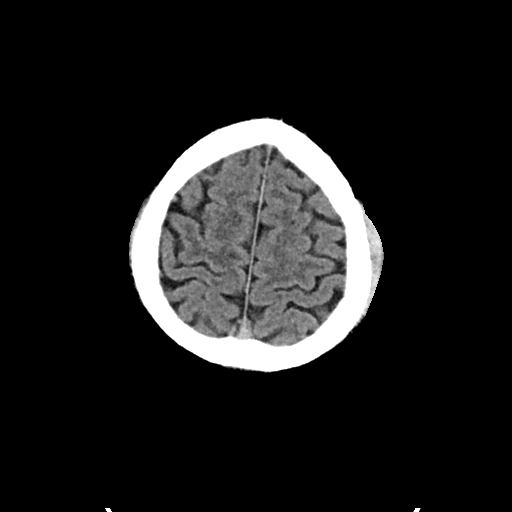
[im 29/34  brain]
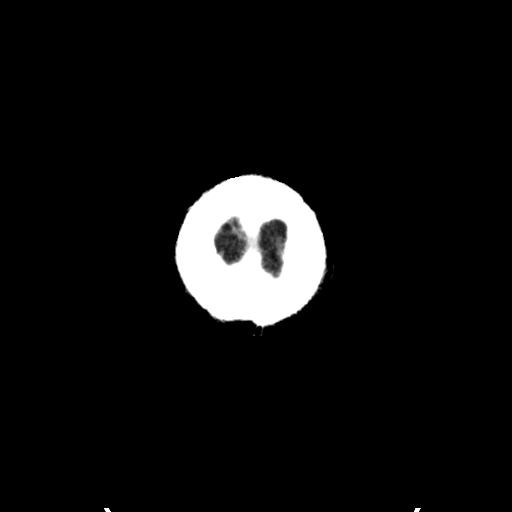

[Series 4: head bone · axial · 0.52mm/px · z∈[-90,-58]mm · 3 of 84 slices shown]
[im 9/84  bone]
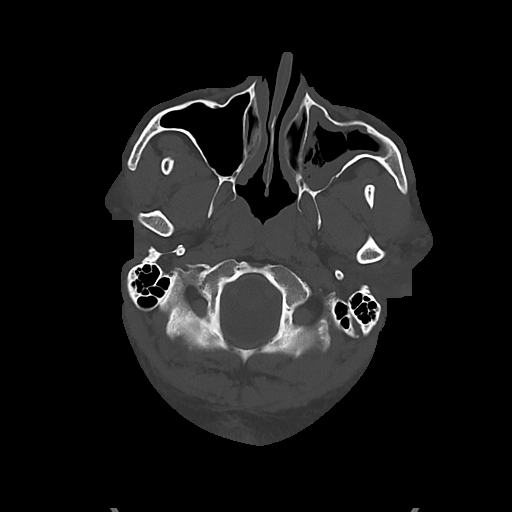
[im 17/84  bone]
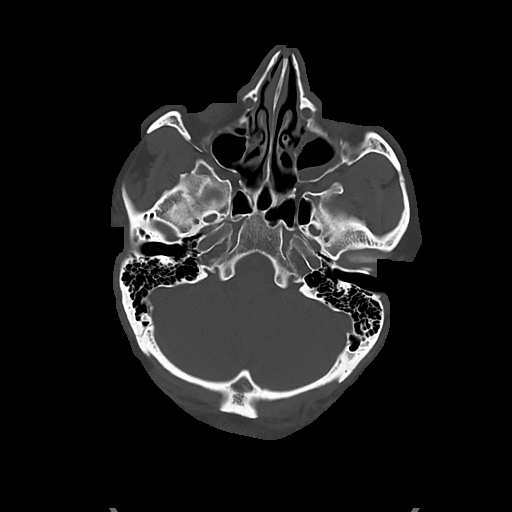
[im 25/84  bone]
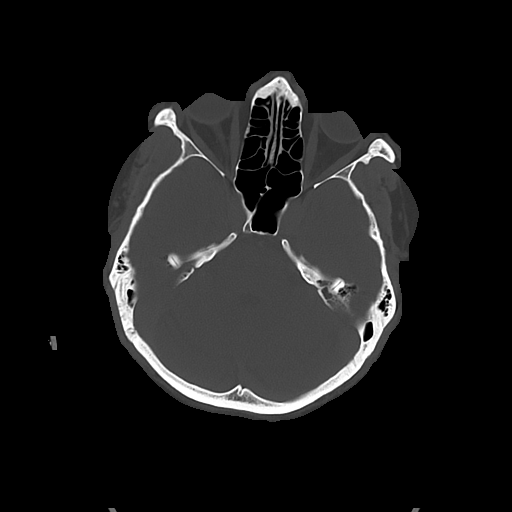

[Series 5: cor soft · coronal · 0.34mm/px · 3 of 77 slices shown]
[im 26/77  brain]
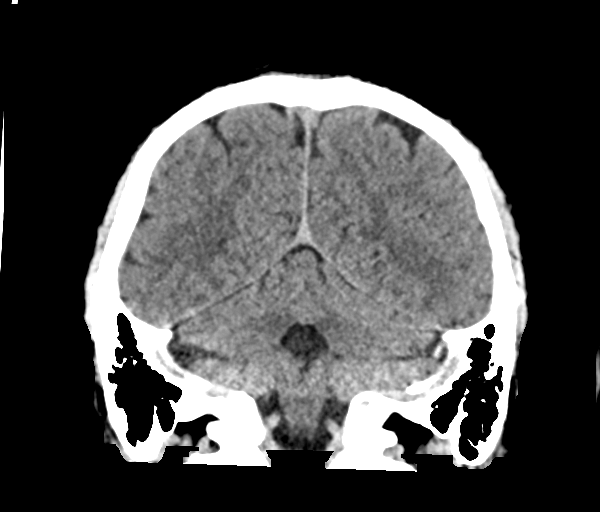
[im 34/77  brain]
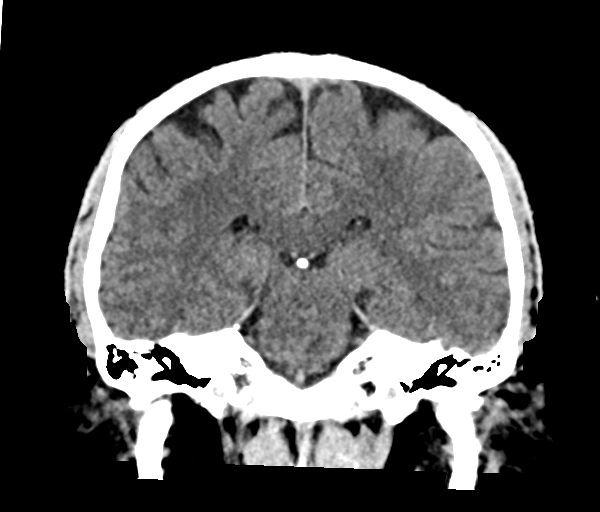
[im 43/77  brain]
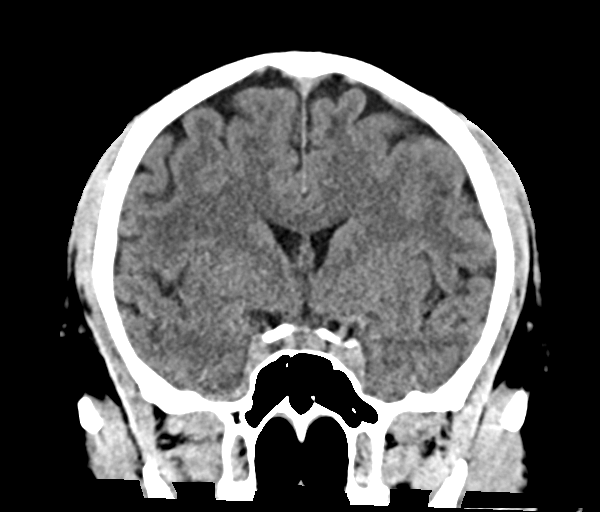

[Series 6: sag soft · sagittal · 0.36mm/px · 3 of 69 slices shown]
[im 23/69  brain]
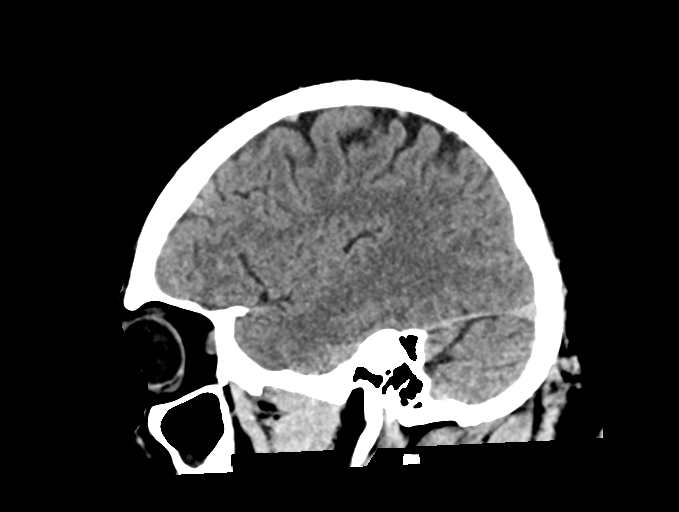
[im 35/69  brain]
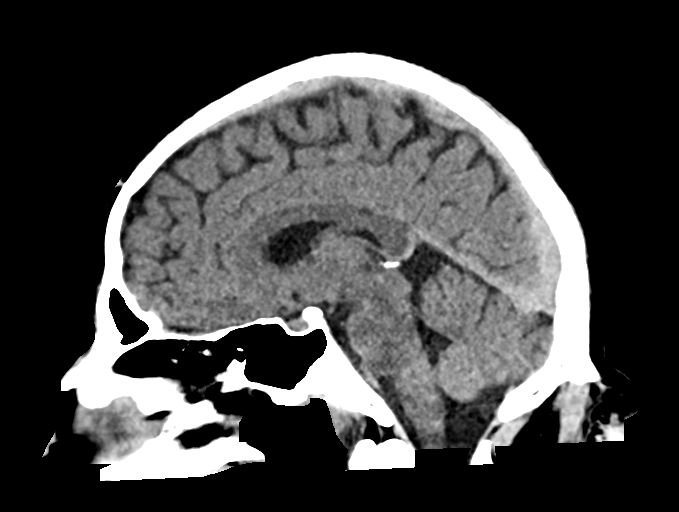
[im 46/69  brain]
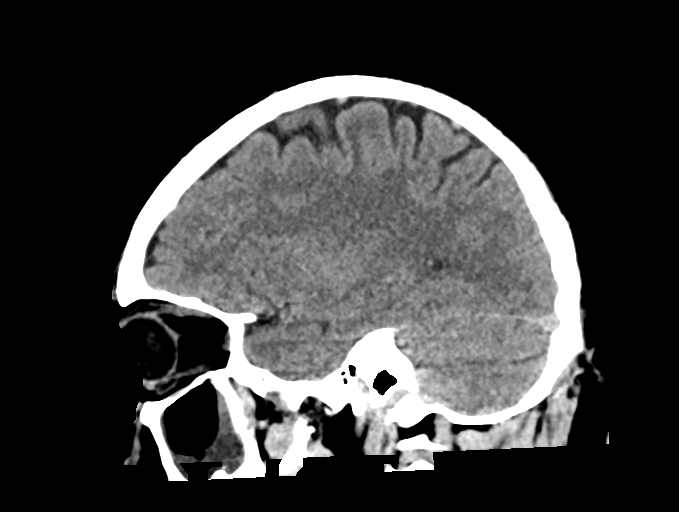

[16 of 47 positions shown; findings below may reference images not displayed]

FINDINGS: Brain: No evidence of acute infarction, hemorrhage, hydrocephalus,
extra-axial collection or mass lesion/mass effect.

Vascular: No hyperdense vessel or unexpected calcification.

Skull: Normal. Negative for fracture or focal lesion.

Sinuses/Orbits: Moderate mucosal thickening in the maxillary sinuses

Other: None
IMPRESSION: 1. Negative non contrasted CT appearance of the brain.
2. Sinus disease
